# Patient Record
Sex: Female | Born: 2005 | Race: White | Hispanic: No | Marital: Single | Smoking: Never smoker
Health system: Southern US, Community
[De-identification: ages and names within clinical notes are randomized; demographics above are authoritative.]

## PROBLEM LIST (undated history)

## (undated) DIAGNOSIS — H539 Unspecified visual disturbance: Secondary | ICD-10-CM

## (undated) DIAGNOSIS — H6690 Otitis media, unspecified, unspecified ear: Secondary | ICD-10-CM

## (undated) DIAGNOSIS — Z8489 Family history of other specified conditions: Secondary | ICD-10-CM

## (undated) DIAGNOSIS — F429 Obsessive-compulsive disorder, unspecified: Secondary | ICD-10-CM

## (undated) DIAGNOSIS — J189 Pneumonia, unspecified organism: Secondary | ICD-10-CM

## (undated) DIAGNOSIS — J050 Acute obstructive laryngitis [croup]: Secondary | ICD-10-CM

## (undated) DIAGNOSIS — J45909 Unspecified asthma, uncomplicated: Secondary | ICD-10-CM

## (undated) DIAGNOSIS — R51 Headache: Secondary | ICD-10-CM

## (undated) DIAGNOSIS — R17 Unspecified jaundice: Secondary | ICD-10-CM

## (undated) DIAGNOSIS — R519 Headache, unspecified: Secondary | ICD-10-CM

## (undated) DIAGNOSIS — T8859XA Other complications of anesthesia, initial encounter: Secondary | ICD-10-CM

## (undated) DIAGNOSIS — F329 Major depressive disorder, single episode, unspecified: Secondary | ICD-10-CM

## (undated) DIAGNOSIS — Q674 Other congenital deformities of skull, face and jaw: Secondary | ICD-10-CM

## (undated) DIAGNOSIS — T4145XA Adverse effect of unspecified anesthetic, initial encounter: Secondary | ICD-10-CM

## (undated) HISTORY — DX: Acute obstructive laryngitis (croup): J05.0

## (undated) HISTORY — PX: OTHER SURGICAL HISTORY: SHX169

---
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
## 2024-02-04 ENCOUNTER — Other Ambulatory Visit (HOSPITAL_COMMUNITY): Payer: Self-pay

## 2024-02-04 MED ORDER — TRAZODONE HCL 50 MG PO TABS
ORAL_TABLET | ORAL | 0 refills | Status: AC
Start: 2024-02-04 — End: ?
  Filled 2024-02-04: qty 90, 90d supply, fill #0

## 2024-02-05 ENCOUNTER — Other Ambulatory Visit (HOSPITAL_COMMUNITY): Payer: Self-pay

## 2024-02-14 ENCOUNTER — Other Ambulatory Visit (HOSPITAL_COMMUNITY): Payer: Self-pay

## 2024-02-22 ENCOUNTER — Other Ambulatory Visit (HOSPITAL_COMMUNITY): Payer: Self-pay

## 2024-03-04 ENCOUNTER — Other Ambulatory Visit (HOSPITAL_COMMUNITY): Payer: Self-pay

## 2024-03-10 ENCOUNTER — Other Ambulatory Visit: Payer: Self-pay

## 2024-03-13 ENCOUNTER — Other Ambulatory Visit: Payer: Self-pay

## 2024-04-09 ENCOUNTER — Other Ambulatory Visit (HOSPITAL_COMMUNITY): Payer: Self-pay

## 2024-04-09 MED ORDER — ATOVAQUONE-PROGUANIL HCL 250-100 MG PO TABS
1.0000 | ORAL_TABLET | Freq: Every day | ORAL | 0 refills | Status: AC
  Filled 2024-04-09: qty 35, 35d supply, fill #0

## 2024-04-10 ENCOUNTER — Other Ambulatory Visit (HOSPITAL_COMMUNITY): Payer: Self-pay

## 2024-04-21 ENCOUNTER — Other Ambulatory Visit: Payer: Self-pay

## 2024-04-21 ENCOUNTER — Other Ambulatory Visit (HOSPITAL_COMMUNITY): Payer: Self-pay

## 2024-04-21 MED ORDER — BICTEGRAVIR-EMTRICITAB-TENOFOV 50-200-25 MG PO TABS
1.0000 | ORAL_TABLET | Freq: Every day | ORAL | 0 refills | Status: AC
  Filled 2024-04-21: qty 30, 30d supply, fill #0

## 2024-04-22 ENCOUNTER — Other Ambulatory Visit (HOSPITAL_COMMUNITY): Payer: Self-pay

## 2024-04-22 ENCOUNTER — Ambulatory Visit (HOSPITAL_COMMUNITY): Admission: EM | Admit: 2024-04-22 | Discharge: 2024-04-22 | Disposition: A | Discharge status: Home / Self Care

## 2024-04-22 ENCOUNTER — Encounter (HOSPITAL_COMMUNITY): Payer: Self-pay | Admitting: Emergency Medicine

## 2024-04-22 DIAGNOSIS — Z113 Encounter for screening for infections with a predominantly sexual mode of transmission: Secondary | ICD-10-CM

## 2024-04-22 HISTORY — DX: Major depressive disorder, single episode, unspecified: F32.9

## 2024-04-22 LAB — POCT URINALYSIS DIP (MANUAL ENTRY)
Bilirubin, UA: NEGATIVE
Blood, UA: NEGATIVE
Glucose, UA: NEGATIVE mg/dL
Ketones, POC UA: NEGATIVE mg/dL
Nitrite, UA: NEGATIVE
Protein Ur, POC: NEGATIVE mg/dL
Spec Grav, UA: 1.02 (ref 1.010–1.025)
Urobilinogen, UA: 1 U/dL
pH, UA: 7 (ref 5.0–8.0)

## 2024-04-22 LAB — HEPATITIS PANEL, ACUTE
HCV Ab: NONREACTIVE
Hep A IgM: NONREACTIVE
Hep B C IgM: NONREACTIVE
Hepatitis B Surface Ag: NONREACTIVE

## 2024-04-22 LAB — HIV ANTIBODY (ROUTINE TESTING W REFLEX): HIV Screen 4th Generation wRfx: NONREACTIVE

## 2024-04-22 LAB — POCT URINE PREGNANCY: Preg Test, Ur: NEGATIVE
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
## 2024-04-22 NOTE — Discharge Instructions (Addendum)
  1. Screening for STD (sexually transmitted disease) (Primary) - POCT urine pregnancy completed UC is negative. - RPR collected in UC and sent to lab - HIV Antibody (routine testing w rflx) collected in UC and sent to lab - Hepatitis panel, acute collect in UC and sent to lab - Cervicovaginal swab collected in UC and sent to lab for further testing results should be available in 2 to 3 days. - POC urinalysis dipstick completed in UC shows trace leukocytes, no blood, no nitrite, no sign of urinary tract infection - Urine Culture collected in UC and sent to lab for further testing results should be available in 2 to 3 days - Patient advised that if any test results are positive she will be contacted appropriate treatment provided.

## 2024-04-23 LAB — RPR: RPR Ser Ql: NONREACTIVE

## 2024-04-24 LAB — CERVICOVAGINAL ANCILLARY ONLY
Bacterial Vaginitis (gardnerella): NEGATIVE
Candida Glabrata: NEGATIVE
Candida Vaginitis: NEGATIVE
Chlamydia: NEGATIVE
Comment: NEGATIVE
Comment: NEGATIVE
Comment: NEGATIVE
Comment: NEGATIVE
Comment: NEGATIVE
Comment: NORMAL
Neisseria Gonorrhea: NEGATIVE
Trichomonas: NEGATIVE

## 2024-05-06 ENCOUNTER — Other Ambulatory Visit (HOSPITAL_COMMUNITY): Payer: Self-pay

## 2024-05-09 ENCOUNTER — Other Ambulatory Visit (HOSPITAL_COMMUNITY): Payer: Self-pay

## 2024-05-15 ENCOUNTER — Other Ambulatory Visit: Payer: Self-pay | Admitting: Internal Medicine

## 2024-05-15 ENCOUNTER — Other Ambulatory Visit (HOSPITAL_COMMUNITY): Payer: Self-pay

## 2024-05-16 ENCOUNTER — Other Ambulatory Visit (HOSPITAL_COMMUNITY): Payer: Self-pay

## 2024-05-16 MED ORDER — DULOXETINE HCL 60 MG PO CPEP
60.0000 mg | ORAL_CAPSULE | Freq: Every day | ORAL | 2 refills | Status: AC
  Filled 2024-05-16: qty 90, 90d supply, fill #0

## 2024-05-16 MED ORDER — PROPRANOLOL HCL ER 60 MG PO CP24
60.0000 mg | ORAL_CAPSULE | Freq: Every day | ORAL | 1 refills | Status: AC
  Filled 2024-05-16: qty 90, 90d supply, fill #0

## 2024-05-16 MED ORDER — NORGESTIMATE-ETH ESTRADIOL 0.25-35 MG-MCG PO TABS
1.0000 | ORAL_TABLET | Freq: Every day | ORAL | 4 refills | Status: AC
  Filled 2024-05-16: qty 84, 84d supply, fill #0

## 2024-05-16 MED ORDER — TRAZODONE HCL 50 MG PO TABS
50.0000 mg | ORAL_TABLET | Freq: Every day | ORAL | 0 refills | Status: AC
  Filled 2024-05-16: qty 90, 90d supply, fill #0

## 2024-06-05 ENCOUNTER — Other Ambulatory Visit (HOSPITAL_COMMUNITY): Payer: Self-pay

## 2024-06-06 ENCOUNTER — Other Ambulatory Visit (HOSPITAL_COMMUNITY): Payer: Self-pay

## 2024-06-07 ENCOUNTER — Other Ambulatory Visit (HOSPITAL_COMMUNITY): Payer: Self-pay

## 2024-08-12 ENCOUNTER — Other Ambulatory Visit (HOSPITAL_COMMUNITY): Payer: Self-pay

## 2024-08-12 ENCOUNTER — Other Ambulatory Visit: Payer: Self-pay

## 2024-08-12 ENCOUNTER — Other Ambulatory Visit: Payer: Self-pay | Admitting: Infectious Diseases

## 2024-08-12 MED ORDER — PROPRANOLOL HCL ER 60 MG PO CP24
60.0000 mg | ORAL_CAPSULE | Freq: Every day | ORAL | 1 refills | Status: AC
  Filled 2024-08-12: qty 90, 90d supply, fill #0
  Filled 2024-08-12: qty 180, 180d supply, fill #0
  Filled 2024-08-12 (×2): qty 90, 90d supply, fill #0
  Filled 2024-08-12: qty 180, 180d supply, fill #0

## 2024-08-12 MED ORDER — NORGESTIMATE-ETH ESTRADIOL 0.25-35 MG-MCG PO TABS
1.0000 | ORAL_TABLET | Freq: Every day | ORAL | 4 refills | Status: AC
  Filled 2024-08-12: qty 84, 84d supply, fill #0

## 2024-08-12 MED ORDER — NORGESTIMATE-ETH ESTRADIOL 0.25-35 MG-MCG PO TABS
1.0000 | ORAL_TABLET | Freq: Every day | ORAL | 5 refills | Status: AC
  Filled 2024-08-12 (×2): qty 84, 84d supply, fill #0

## 2024-08-12 MED ORDER — DULOXETINE HCL 60 MG PO CPEP
60.0000 mg | ORAL_CAPSULE | Freq: Every day | ORAL | 2 refills | Status: AC
  Filled 2024-08-12 – 2024-09-19 (×2): qty 90, 90d supply, fill #0

## 2024-08-12 MED ORDER — DULOXETINE HCL 60 MG PO CPEP
60.0000 mg | ORAL_CAPSULE | Freq: Every day | ORAL | 5 refills | Status: AC
  Filled 2024-08-12 (×2): qty 30, 30d supply, fill #0

## 2024-09-19 ENCOUNTER — Other Ambulatory Visit: Payer: Self-pay

## 2024-09-19 ENCOUNTER — Other Ambulatory Visit (HOSPITAL_COMMUNITY): Payer: Self-pay

## 2024-09-19 MED ORDER — DULOXETINE HCL 60 MG PO CPEP
60.0000 mg | ORAL_CAPSULE | Freq: Every day | ORAL | 2 refills | Status: AC
  Filled 2024-09-19: qty 90, 90d supply, fill #0

## 2024-09-22 ENCOUNTER — Other Ambulatory Visit (HOSPITAL_COMMUNITY): Payer: Self-pay

## 2024-09-22 MED ORDER — DULOXETINE HCL 60 MG PO CPEP: 60.0000 mg | ORAL_CAPSULE | Freq: Every day | ORAL | 2 refills | Status: AC

## 2024-09-26 ENCOUNTER — Other Ambulatory Visit (HOSPITAL_COMMUNITY): Payer: Self-pay

## 2024-10-06 DIAGNOSIS — D224 Melanocytic nevi of scalp and neck: Secondary | ICD-10-CM | POA: Diagnosis not present

## 2024-10-06 DIAGNOSIS — F411 Generalized anxiety disorder: Secondary | ICD-10-CM | POA: Diagnosis not present

## 2024-10-06 DIAGNOSIS — F331 Major depressive disorder, recurrent, moderate: Secondary | ICD-10-CM | POA: Diagnosis not present

## 2024-10-31 ENCOUNTER — Other Ambulatory Visit: Payer: Self-pay

## 2024-10-31 ENCOUNTER — Other Ambulatory Visit (HOSPITAL_COMMUNITY): Payer: Self-pay

## 2024-10-31 MED ORDER — ACETAMINOPHEN-CODEINE 300-30 MG PO TABS
1.0000 | ORAL_TABLET | Freq: Four times a day (QID) | ORAL | 0 refills | Status: AC | PRN
  Filled 2024-10-31 (×2): qty 15, 4d supply, fill #0

## 2024-10-31 MED ORDER — AMOXICILLIN 500 MG PO CAPS
500.0000 mg | ORAL_CAPSULE | Freq: Three times a day (TID) | ORAL | 0 refills | Status: AC
  Filled 2024-10-31: qty 21, 7d supply, fill #0

## 2024-11-20 ENCOUNTER — Other Ambulatory Visit (HOSPITAL_COMMUNITY): Payer: Self-pay

## 2024-11-20 MED ORDER — NORGESTIMATE-ETH ESTRADIOL 0.25-35 MG-MCG PO TABS
1.0000 | ORAL_TABLET | Freq: Every day | ORAL | 0 refills | Status: AC
  Filled 2024-11-20: qty 84, 84d supply, fill #0
